# Patient Record
Sex: Female | Born: 1955 | Race: White | Hispanic: No | Marital: Married | Smoking: Former smoker
Health system: Southern US, Community
[De-identification: ages and names within clinical notes are randomized; demographics above are authoritative.]

## PROBLEM LIST (undated history)

## (undated) HISTORY — PX: BREAST REDUCTION SURGERY: SHX8

## (undated) HISTORY — PX: HAIR TRANSPLANT: SHX1719

## (undated) HISTORY — PX: LIPOSUCTION: SHX10

---

## 2007-02-05 ENCOUNTER — Encounter: Payer: Self-pay | Admitting: Obstetrics & Gynecology

## 2007-02-05 ENCOUNTER — Ambulatory Visit: Payer: Self-pay | Admitting: Obstetrics & Gynecology

## 2008-02-11 ENCOUNTER — Ambulatory Visit: Payer: Self-pay | Admitting: Family Medicine

## 2008-02-11 ENCOUNTER — Encounter: Payer: Self-pay | Admitting: Family Medicine

## 2009-02-17 ENCOUNTER — Encounter: Payer: Self-pay | Admitting: Obstetrics & Gynecology

## 2009-02-17 ENCOUNTER — Ambulatory Visit: Payer: Self-pay | Admitting: Obstetrics & Gynecology

## 2009-10-15 HISTORY — PX: COLONOSCOPY W/ POLYPECTOMY: SHX1380

## 2010-03-09 ENCOUNTER — Ambulatory Visit: Payer: Self-pay | Admitting: Obstetrics & Gynecology

## 2010-03-10 ENCOUNTER — Encounter: Payer: Self-pay | Admitting: Obstetrics & Gynecology

## 2010-03-10 LAB — CONVERTED CEMR LAB: TSH: 1.007 microintl units/mL (ref 0.350–4.500)

## 2011-02-27 NOTE — Assessment & Plan Note (Signed)
Kristin Douglas, Kristin Douglas NO.:  [id number]   MEDICAL RECORD NO.:  [id number]          PATIENT TYPE:  POB   LOCATION:  [facility] at [facility]         FACILITY:  [facility]   PHYSICIAN:  Allie Bossier, MD        DATE OF BIRTH:  05/25/1956   DATE OF SERVICE:  03/09/2010                                  CLINIC NOTE   Ms. Reddoch is a 55 year old married white gravida 2, para 1-0-1-1 who  is here for annual exam.  She wants a refill on her femhrt.  She has no  particular GYN complaints.   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   REVIEW OF SYSTEMS:  She had an echocardiogram that was done in April  that was negative.   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  No known drug allergies.  No latex allergies.   SOCIAL HISTORY:  She reports rare alcohol.   FAMILY HISTORY:  Negative for breast and GYN cancer, but positive for  colon cancer in her father and her grandfather.  Father also had a heart  attack Her mammogram is due and her colonoscopy was done this year and a  polyp was removed, presented polyp was previously thought to be a  hemorrhoid.   MEDICATIONS:  Oracea, tretinoin, Ritalin, omega III, clobetasol ointment  (she prefers the cream), Imitrex as necessary and RhoGAM for her hair  loss.   PHYSICAL EXAMINATION:  GENERAL:  Well nourished, well hydrated very  pleasant white female, appears younger than her stated age.  VITAL SIGNS:  Height 5 feet 3 inches, weight 144, blood pressure 128/84,  pulse is 56.  HEENT:  Normal.  BREASTS:  Normal bilaterally.  HEART:  Regular rate and rhythm.  LUNGS:  Clear to auscultation bilaterally.  ABDOMEN:  Benign.  PELVIC:  External genitalia, moderate amount of atrophy.  Cervix appears  normal.  Bimanual, uterus normal size and shape.  Adnexa nontender.  No  masses.   ASSESSMENT AND PLAN:  1. Annual exam.  I checked the Pap smear and recommend  self-breast and      self-vulvar exams.  Recommended annual mammogram.  2. Hormone replacement therapy.  I have given a refill on her femhrt.  3. Low pulse and hair loss and she can have a TSH today.      Allie Bossier, MD     MCD/MEDQ  D:  03/09/2010  T:  03/09/2010  Job:  [id number]

## 2011-02-27 NOTE — Assessment & Plan Note (Signed)
Kristin Douglas, Kristin Douglas               ACCOUNT NO.:  [id number]   MEDICAL RECORD NO.:  [id number]          PATIENT TYPE:  POB   LOCATION:  [facility]         FACILITY:  [facility]   PHYSICIAN:  Tinnie Gens, MD        DATE OF BIRTH:  03-09-1956   DATE OF SERVICE:  02/11/2008                                  CLINIC NOTE   CHIEF COMPLAINT:  Yearly exam.   HISTORY OF PRESENT ILLNESS:  The patient is a 55 year old gravida 2,  para 1-0-1-1 who is here today for yearly exam.  She is without  significant complaint.  She continues on HRT and has been on it for  several years now.  She reports continued symptoms even with a lower  dosage of Femhrt which was given to her last year of not feeling  herself, not acting quite right, increased hot flashes and she desires  to stay on her HRT.   PAST MEDICAL HISTORY:  1. Significant for hemorrhoids.  2. ADD.  3. History of lichen sclerosis or lichen planus.  4. Congenital AVM of the liver.  No change after two ultrasounds.   PAST SURGICAL HISTORY:  1. Cholecystectomy.  2. Appendectomy.  3. Breast reduction.  4. Liposuction.   FAMILY HISTORY:  No breast or GYN cancer.  She has a history of colon  cancer in her father who died at age 37.  Her last colonoscopy was in  2007 in [place].  She gets one every 5 years.   SOCIAL HISTORY:  No tobacco, rare alcohol use.  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   MEDICATIONS:  1. She is on Femhrt 1.5 one p.o. daily.  2. __________ given to her for rosacea by her dermatologist.  3. She is on a multivitamin daily.  4. Cod liver oil pills.  5. Biotin daily.   ALLERGIES:  None known.   OBSTETRICAL HISTORY:  G 1.  She has one living child, one vaginal  delivery.   GYN HISTORY:  Negative.   REVIEW OF SYSTEMS:  Reviewed.  She denies headache, vision changes,  chest pain, shortness of breath, trouble with nausea, vomiting,  diarrhea, constipation.  She is  sexually active without difficulty.  There is no breast lumps noted.  The patient complains of hair loss and  fatigue and weight gain.   PHYSICAL EXAMINATION:  Vitals are as in the chart.  Blood pressure is  135/93. Last year it was 130/82.  Weight is 151 pounds, up 6 pounds from  last year.  GENERAL:  She is a well-developed, well-nourished female in no acute  distress.  NECK:  Supple with normal thyroid.  LUNGS:  Clear bilaterally.  CV:  Regular rate and rhythm. No rubs, gallops or murmurs.  ABDOMEN:  Soft, nontender, nondistended.  EXTREMITIES:  No cyanosis, clubbing or edema.  2+ distal pulses.  BREASTS:  Symmetric with everted nipples.  Scars from breast reduction  are easily visible, but well-healed.  There is no significant masses or  supraclavicular or axillary adenopathy.  GU:  Normal external female genitalia.  BUS was normal.  Vagina  is  atrophic and pale.  There is loss of rugation.  The cervix is parous  without lesion.  Uterus is small, anteverted.  No adnexal mass or  tenderness.   IMPRESSION:  Yearly exam and fatigue.   PLAN:  1. Pap smear today.  2. Schedule mammogram.  3. Check TSH and lipid panel.  4. Refill Femhrt.           ______________________________  Tinnie Gens, MD     TP/MEDQ  D:  02/11/2008  T:  02/11/2008  Job:  [id number]

## 2011-02-27 NOTE — Assessment & Plan Note (Signed)
Kristin Douglas, Kristin Douglas               ACCOUNT NO.:  [id number]   MEDICAL RECORD NO.:  [id number]          PATIENT TYPE:  POB   LOCATION:  [facility]         FACILITY:  [facility]   PHYSICIAN:  Allie Bossier, MD        DATE OF BIRTH:  1956/06/03   DATE OF SERVICE:  02/17/2009                                  CLINIC NOTE   Kristin Douglas is a 55 year old married white G2, P1, A1 who is here for  annual exam.  She has no particular complaints today.  Today, she  complains of dyspareunia secondary to vaginal atrophy.  She also needs  refill on her Temovate ointment and her femhrt.   PAST MEDICAL HISTORY:  Lichen sclerosis/lichen planus, borderline  elevated cholesterol, please note it was checked January 22, 2009, ADD,  hemorrhoids, and acne.   PAST SURGICAL HISTORY:  She had cholecystectomy, appendectomy, breast  reduction, and liposuction.   FAMILY HISTORY:  Negative for breast and GYN cancers.  Her father had  colon cancer and died of an MI at 66.   SOCIAL HISTORY:  She reports rare alcohol, but no tobacco or drug use.   ALLERGIES:  No known drug allergies.  No latex allergies.   REVIEW OF SYSTEMS:  She is married for 22 years.  She is sexually  active.  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   MEDICATIONS:  1. Femhrt 1/5 ratio 1 daily.  2. Tretinoin 0.5% topically.  3. Ritalin 10 mg a day.  4. Clobetasol ointment three times a week to her vulva.   PHYSICAL EXAMINATION:  VITAL SIGNS:  Weight 147 pounds, height 5 feet 3  inches, blood pressure 133/88, pulse 64.  HEENT:  Normal.  HEART:  Regular rate and rhythm.  LUNGS:  Clear to auscultation bilaterally.  BREAST:  Normal bilaterally.  ABDOMEN:  Benign.  No hepatosplenomegaly.  EXTERNAL GENITALIA:  She has vulvar changes consistent with lichen  sclerosis especially around the clitoris, is a little bit in the  posterior fourchette.  She has certainly got atrophy.  Her cervix  appears healthy without lesions.   Bimanual reveals normal size, shape,  anteverted, mobile uterus.  Adnexa are nonenlarged and nontender.   ASSESSMENT AND PLAN:  Annual exam.  I am refilling all her medications  in addition for the symptomatic vaginal atrophy and giving her samples  and prescription of Vagifem tablets 25 mcg two times a week.  She will  follow up in a year or sooner as necessary.      Allie Bossier, MD     MCD/MEDQ  D:  02/17/2009  T:  02/18/2009  Job:  [id number]

## 2011-04-05 ENCOUNTER — Ambulatory Visit (INDEPENDENT_AMBULATORY_CARE_PROVIDER_SITE_OTHER): Payer: No Typology Code available for payment source | Admitting: Obstetrics & Gynecology

## 2011-04-05 DIAGNOSIS — Z01419 Encounter for gynecological examination (general) (routine) without abnormal findings: Secondary | ICD-10-CM

## 2011-04-05 DIAGNOSIS — Z1272 Encounter for screening for malignant neoplasm of vagina: Secondary | ICD-10-CM

## 2011-04-06 NOTE — Assessment & Plan Note (Unsigned)
NAMEFREDERIKA, HUKILL NO.:  [id number]  MEDICAL RECORD NO.:  [id number]           PATIENT TYPE:  LOCATION:  [facility]           FACILITY:  PHYSICIAN:  Allie Bossier, MD             DATE OF BIRTH:  DATE OF SERVICE:  04/05/2011                                 CLINIC NOTE  Ms. Mangano is a 55 year old married white G2, P1-0-1-1, who is here for annual exam.  She has no particular GYN complaints.  She does not need refills on any medicines today.  PAST MEDICAL HISTORY:  ADD, lichen sclerosus/planus, history of a angioma/AVM of her liver.  REVIEW OF SYSTEMS:  Married for the last 22 years.  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  She is status post echocardiogram in April 2011 that was negative.  She has had multiple colonoscopies, last of which was in 2011, with polyp removal.  She states up to date on her mammogram.  She has intercourse and uses vaginal lubrication.  She denies dyspareunia.  , elevated cholesterol, hemorrhoids, cystic acne, migraines, and she was recently diagnosed with depression (Dr. Vaughan Basta in [place], [place]).  PREVIOUS SURGERIES:  Cholecystectomy, appendectomy, breast reduction, and liposuction.  FAMILY HISTORY:  Negative for breast and GYN cancer.  Colon cancer was present her father and her paternal grandfather.  Cardiac disease is also in her family.  SOCIAL HISTORY:  Rare tobacco, rare alcohol, nothing else.  No known drug allergies.  No latex allergies.  MEDICATIONS: 1. Oracea daily. 2. Tretinoin 0.5% topical ointment daily. 3. Ritalin 10 mg p.r.n. 4. Omega-3 daily. 5. Clobetasol ointment on a p.r.n. basis. 6. Imitrex p.r.n. 7. Maxalt p.r.n. 8. Pristiq daily.  PHYSICAL EXAMINATION:  GENERAL:  Well-nourished, well-hydrated pleasant white female. VITAL SIGNS:  Height 5 feet 3 inches, weight 156, blood pressure 123/81, pulse 79. HEENT:  Normal. BREASTS:  Normal bilaterally. HEART:   Regular rate and rhythm. LUNGS:  Clear to auscultation bilaterally. ABDOMEN:  Benign. EXTERNAL GENITALIA:  Slightly atrophic, vagina atrophic.  No lesions. No abnormal discharge.  Cervix, no abnormal discharge.  Uterus normal size, shape, anteverted, mobile.  Adnexa nontender.  No masses.  ASSESSMENT AND PLAN:  Annual exam.  I have checked Pap smear. Recommended self-breast and self-vulvar exams.  She will get an annual mammogram.  I have recommended to eat oatmeal daily and weight loss.     Allie Bossier, MD    MCD/MEDQ  D:  04/05/2011  T:  04/06/2011  Job:  [id number]

## 2011-10-22 ENCOUNTER — Other Ambulatory Visit: Payer: Self-pay | Admitting: Gynecology

## 2011-10-22 ENCOUNTER — Other Ambulatory Visit (INDEPENDENT_AMBULATORY_CARE_PROVIDER_SITE_OTHER): Payer: Self-pay | Admitting: Gynecology

## 2011-10-22 DIAGNOSIS — E349 Endocrine disorder, unspecified: Secondary | ICD-10-CM

## 2011-10-22 MED ORDER — NORETHINDRONE-ETH ESTRADIOL 0.5-2.5 MG-MCG PO TABS: 1.0000 | ORAL_TABLET | Freq: Every day | ORAL | Status: DC

## 2011-10-22 NOTE — Telephone Encounter (Signed)
Spoke to patient need refill. Refill call in to pharmacy.

## 2012-02-20 ENCOUNTER — Other Ambulatory Visit: Payer: Self-pay | Admitting: Obstetrics & Gynecology

## 2012-04-03 ENCOUNTER — Encounter: Payer: Self-pay | Admitting: Obstetrics & Gynecology

## 2013-01-17 ENCOUNTER — Other Ambulatory Visit: Payer: Self-pay | Admitting: Obstetrics & Gynecology

## 2013-01-29 ENCOUNTER — Ambulatory Visit (INDEPENDENT_AMBULATORY_CARE_PROVIDER_SITE_OTHER): Admitting: Obstetrics & Gynecology

## 2013-01-29 ENCOUNTER — Encounter: Payer: Self-pay | Admitting: Obstetrics & Gynecology

## 2013-01-29 VITALS — BP 135/91 | HR 88 | Ht 63.0 in | Wt 161.0 lb

## 2013-01-29 DIAGNOSIS — Z01419 Encounter for gynecological examination (general) (routine) without abnormal findings: Secondary | ICD-10-CM

## 2013-01-29 DIAGNOSIS — Z0000 Encounter for general adult medical examination without abnormal findings: Secondary | ICD-10-CM

## 2013-01-29 DIAGNOSIS — Z124 Encounter for screening for malignant neoplasm of cervix: Secondary | ICD-10-CM

## 2013-01-29 DIAGNOSIS — Z1151 Encounter for screening for human papillomavirus (HPV): Secondary | ICD-10-CM

## 2013-01-29 MED ORDER — ESTRADIOL-LEVONORGESTREL 0.045-0.015 MG/DAY TD PTWK: 1.0000 | MEDICATED_PATCH | TRANSDERMAL | Status: DC

## 2013-01-29 NOTE — Progress Notes (Signed)
Subjective:    Kristin Douglas is a 57 y.o. female who presents for an annual exam. The patient has no complaints today. She wants a refill of HRT, would like to try a patch. The patient is sexually active. GYN screening history: last pap: was normal. The patient wears seatbelts: yes. The patient participates in regular exercise: yes. Has the patient ever been transfused or tattooed?: no. The patient reports that there is not domestic violence in her life.   Menstrual History: OB History   Grav Para Term Preterm Abortions TAB SAB Ect Mult Living   3 1 1  0 2 0 2 0 0 1      Menarche age: 65  No LMP recorded. Patient is postmenopausal.    The following portions of the patient's history were reviewed and updated as appropriate: allergies, current medications, past family history, past medical history, past social history, past surgical history and problem list.  Review of Systems A comprehensive review of systems was negative. RN at [employer], married for 25 years, some dryness with sex.   Objective:    BP 135/91  Pulse 88  Ht 5\' 3"  (1.6 m)  Wt 161 lb (73.029 kg)  BMI 28.53 kg/m2  General Appearance:    Alert, cooperative, no distress, appears stated age  Head:    Normocephalic, without obvious abnormality, atraumatic  Eyes:    PERRL, conjunctiva/corneas clear, EOM's intact, fundi    benign, both eyes  Ears:    Normal TM's and external ear canals, both ears  Nose:   Nares normal, septum midline, mucosa normal, no drainage    or sinus tenderness  Throat:   Lips, mucosa, and tongue normal; teeth and gums normal  Neck:   Supple, symmetrical, trachea midline, no adenopathy;    thyroid:  no enlargement/tenderness/nodules; no carotid   bruit or JVD  Back:     Symmetric, no curvature, ROM normal, no CVA tenderness  Lungs:     Clear to auscultation bilaterally, respirations unlabored  Chest Wall:    No tenderness or deformity   Heart:    Regular rate and rhythm, S1 and S2 normal, no murmur,  rub   or gallop  Breast Exam:    No tenderness, masses, or nipple abnormality  Abdomen:     Soft, non-tender, bowel sounds active all four quadrants,    no masses, no organomegaly  Genitalia:    Normal female without lesion, discharge or tenderness, marked atrophy of vulva/vagina. Lichen planus changes, stenotic cervix. NSSA, NT, mobile, normal adnexal exam     Extremities:   Extremities normal, atraumatic, no cyanosis or edema  Pulses:   2+ and symmetric all extremities  Skin:   Skin color, texture, turgor normal, no rashes or lesions  Lymph nodes:   Cervical, supraclavicular, and axillary nodes normal  Neurologic:   CNII-XII intact, normal strength, sensation and reflexes    throughout  .    Assessment:    Healthy female exam.    Plan:     Thin prep Pap smear.  with HPV cotesting Generic climara/pro Refill clobetasol QOD

## 2013-01-29 NOTE — Progress Notes (Signed)
Interested in changing to vivelle dot but concerned about the cost.

## 2013-02-03 ENCOUNTER — Encounter: Payer: Self-pay | Admitting: Obstetrics & Gynecology

## 2013-03-24 ENCOUNTER — Encounter: Payer: Self-pay | Admitting: Family Medicine

## 2013-03-24 ENCOUNTER — Ambulatory Visit (INDEPENDENT_AMBULATORY_CARE_PROVIDER_SITE_OTHER): Admitting: Family Medicine

## 2013-03-24 ENCOUNTER — Other Ambulatory Visit: Payer: Self-pay | Admitting: Family Medicine

## 2013-03-24 VITALS — BP 120/90 | HR 81 | Ht 63.0 in | Wt 160.0 lb

## 2013-03-24 DIAGNOSIS — N950 Postmenopausal bleeding: Secondary | ICD-10-CM

## 2013-03-24 DIAGNOSIS — Q279 Congenital malformation of peripheral vascular system, unspecified: Secondary | ICD-10-CM

## 2013-03-24 DIAGNOSIS — Q2730 Arteriovenous malformation, site unspecified: Secondary | ICD-10-CM

## 2013-03-24 DIAGNOSIS — F988 Other specified behavioral and emotional disorders with onset usually occurring in childhood and adolescence: Secondary | ICD-10-CM

## 2013-03-24 DIAGNOSIS — L940 Localized scleroderma [morphea]: Secondary | ICD-10-CM

## 2013-03-24 DIAGNOSIS — N904 Leukoplakia of vulva: Secondary | ICD-10-CM

## 2013-03-24 MED ORDER — LIDOCAINE VISCOUS 2 % MT SOLN: 15.0000 mL | OROMUCOSAL | Status: DC | PRN

## 2013-03-24 NOTE — Patient Instructions (Signed)
Postmenopausal Bleeding Menopause is commonly referred to as the "change in life." It is a time when the fertile years, the time of ovulating and having menstrual periods, has come to an end. It is also determined by not having menstrual periods for 12 months.  Postmenopausal bleeding is any bleeding a woman has after she has entered into menopause. Any type of postmenopausal bleeding, even if it appears to be a typical menstrual period, is concerning. This should be evaluated by your caregiver.  CAUSES   Hormone therapy.  Cancer of the cervix or cancer of the lining of the uterus (endometrial cancer).  Thinning of the uterine lining (uterine atrophy).  Thyroid diseases.  Certain medicines.  Infection of the uterus or cervix.  Inflammation or irritation of the uterine lining (endometritis).  Estrogen-secreting tumors.  Growths (polyps) on the cervix, uterine lining, or uterus.  Uterine tumors (fibroids).  Being very overweight (obese). DIAGNOSIS  Your caregiver will take a medical history and ask questions. A physical exam will also be performed. Further tests may include:   A transvaginal ultrasound. An ultrasound wand or probe is inserted into your vagina to view the pelvic organs.  A biopsy of the lining of the uterus (endometrium). A sample of the endometrium is removed and examined.  A hysteroscopy. Your caregiver may use an instrument with a light and a camera attached to it (hysteroscope). The hysteroscope is used to look inside the uterus for problems.  A dilation and curettage (D&C). Tissue is removed from the uterine lining to be examined for problems. TREATMENT  Treatment depends on the cause of the bleeding. Some treatments include:   Surgery.  Medicines.  Hormones.  A hysteroscopy or D&C to remove polyps or fibroids.  Changing or stopping a current medicine you are taking. Talk to your caregiver about your specific treatment. HOME CARE INSTRUCTIONS    Maintain a healthy weight.  Keep regular pelvic exams and Pap tests. SEEK MEDICAL CARE IF:   You have bleeding, even if it is light in comparison to your previous periods.  Your bleeding lasts more than 1 week.  You have abdominal pain.  You develop bleeding with sexual intercourse. SEEK IMMEDIATE MEDICAL CARE IF:   You have a fever, chills, headache, dizziness, muscle aches, and bleeding.  You have severe pain with bleeding.  You are passing blood clots.  You have bleeding and need more than 1 pad an hour.  You feel faint. MAKE SURE YOU:  Understand these instructions.  Will watch your condition.  Will get help right away if you are not doing well or get worse. Document Released: 01/09/2006 Document Revised: 12/24/2011 Document Reviewed: 06/07/2011 ExitCare Patient Information 2014 [employer].  

## 2013-03-24 NOTE — Assessment & Plan Note (Addendum)
Given PMB, will hold HRT for now, check U/S.  Have given pt. rx for viscous lidocaine if needed for possible EMB, I have discussed this with her, she declines it today.  Given that she works in [place], will arrange f/u at the [place] office.

## 2013-03-24 NOTE — Progress Notes (Signed)
  Subjective:    Patient ID: Kristin Douglas, female    DOB: 1956/08/23, 57 y.o.   MRN: [id number]  HPI Seen by dr. Marice Potter in 01/2013.  HRT changed from Femhrt to ClimaraPro.  She was tolerating ok, except for headaches on patch change day and some stomach cramps.  Began bleeding 5 days ago, which has been heavy like a cycle.  Has h/o atrophic vaginitis and lichen sclerosis, but does not easily tolerate speculum exams.  Reports being on HRT x 8-9 years, stopped them when started bleeding this weekend and has been tearful and moody.  She is worried about stopping her hormones.  She had previous D & C for possible thickened endometrium while a pt. In [place] with Dr. Marice Potter.   Review of Systems  Constitutional: Positive for unexpected weight change (weight loss).  Respiratory: Negative for shortness of breath.   Cardiovascular: Negative for chest pain.  Gastrointestinal: Negative for abdominal pain.  Genitourinary: Positive for vaginal pain. Negative for dysuria.       Objective:   Physical Exam  Vitals reviewed. Constitutional: She appears well-developed and well-nourished.  HENT:  Head: Normocephalic and atraumatic.  Eyes: No scleral icterus.  Neck: Neck supple.  Cardiovascular: Normal rate.   Pulmonary/Chest: Effort normal.  Abdominal: Soft.          Assessment & Plan:

## 2013-03-26 ENCOUNTER — Encounter: Payer: Self-pay | Admitting: Family Medicine

## 2013-03-26 ENCOUNTER — Ambulatory Visit (HOSPITAL_BASED_OUTPATIENT_CLINIC_OR_DEPARTMENT_OTHER)
Admission: RE | Admit: 2013-03-26 | Discharge: 2013-03-26 | Disposition: A | Discharge status: Home / Self Care | Source: Ambulatory Visit | Attending: Family Medicine | Admitting: Family Medicine

## 2013-03-26 ENCOUNTER — Ambulatory Visit (HOSPITAL_COMMUNITY)

## 2013-03-26 DIAGNOSIS — N950 Postmenopausal bleeding: Secondary | ICD-10-CM

## 2013-04-07 ENCOUNTER — Telehealth: Payer: Self-pay | Admitting: *Deleted

## 2013-04-07 ENCOUNTER — Ambulatory Visit: Admitting: Obstetrics & Gynecology

## 2013-04-07 NOTE — Telephone Encounter (Signed)
Pt aware of results 

## 2013-04-07 NOTE — Telephone Encounter (Signed)
Message copied by Grayland Ormond on Tue Apr 07, 2013 11:50 AM ------      Message from: Reva Bores      Created: Thu Mar 26, 2013  4:42 PM       Lining is thin--that is reassuring--please pass on to pt. ------

## 2013-04-09 ENCOUNTER — Encounter: Payer: Self-pay | Admitting: Obstetrics & Gynecology

## 2013-04-09 ENCOUNTER — Ambulatory Visit (INDEPENDENT_AMBULATORY_CARE_PROVIDER_SITE_OTHER): Admitting: Obstetrics & Gynecology

## 2013-04-09 VITALS — BP 154/84 | HR 102 | Resp 17 | Ht 63.0 in | Wt 163.0 lb

## 2013-04-09 DIAGNOSIS — N950 Postmenopausal bleeding: Secondary | ICD-10-CM

## 2013-04-09 NOTE — Progress Notes (Signed)
  Subjective:    Patient ID: Kristin Douglas, female    DOB: 1956/06/26, 57 y.o.   MRN: [id number]  HPI  She had one occasion of PMB. Her u/s showed an endometial lining of 2mm.  Review of Systems     Objective:   Physical Exam        Assessment & Plan:   PMB- She is symptomatic without her HRT and she would like to restart. This is fine with me. I have explained that her u/s rules out 99% of uterine cancer cases. I did offer her a EMBX but I am not strongly recommending it.

## 2013-08-20 ENCOUNTER — Other Ambulatory Visit: Payer: Self-pay

## 2013-10-20 ENCOUNTER — Telehealth: Payer: Self-pay | Admitting: *Deleted

## 2013-10-20 DIAGNOSIS — N951 Menopausal and female climacteric states: Secondary | ICD-10-CM

## 2013-10-20 MED ORDER — CLOBETASOL PROPIONATE 0.05 % EX OINT: 1.0000 "application " | TOPICAL_OINTMENT | Freq: Two times a day (BID) | CUTANEOUS | Status: DC

## 2013-10-20 NOTE — Telephone Encounter (Signed)
Pt called and needed a refill on Clobetasol ointment.  I have sent in a refill on her Clobetasol.  Pt aware.

## 2014-03-09 ENCOUNTER — Telehealth: Payer: Self-pay | Admitting: *Deleted

## 2014-03-09 ENCOUNTER — Other Ambulatory Visit: Payer: Self-pay | Admitting: Obstetrics & Gynecology

## 2014-03-09 DIAGNOSIS — Z780 Asymptomatic menopausal state: Secondary | ICD-10-CM

## 2014-03-09 MED ORDER — NORETHINDRONE-ETH ESTRADIOL 0.5-2.5 MG-MCG PO TABS: 2.0000 | ORAL_TABLET | Freq: Every day | ORAL | Status: DC

## 2014-03-09 NOTE — Telephone Encounter (Signed)
Patient needs refill of femhrt.

## 2014-04-01 ENCOUNTER — Encounter: Payer: Self-pay | Admitting: Obstetrics & Gynecology

## 2014-04-01 ENCOUNTER — Ambulatory Visit (INDEPENDENT_AMBULATORY_CARE_PROVIDER_SITE_OTHER): Admitting: Obstetrics & Gynecology

## 2014-04-01 VITALS — BP 115/81 | HR 75 | Ht 63.0 in | Wt 158.0 lb

## 2014-04-01 DIAGNOSIS — N951 Menopausal and female climacteric states: Secondary | ICD-10-CM

## 2014-04-01 DIAGNOSIS — Z01419 Encounter for gynecological examination (general) (routine) without abnormal findings: Secondary | ICD-10-CM

## 2014-04-01 DIAGNOSIS — Z0000 Encounter for general adult medical examination without abnormal findings: Secondary | ICD-10-CM

## 2014-04-01 DIAGNOSIS — Z124 Encounter for screening for malignant neoplasm of cervix: Secondary | ICD-10-CM

## 2014-04-01 DIAGNOSIS — Z1151 Encounter for screening for human papillomavirus (HPV): Secondary | ICD-10-CM

## 2014-04-01 DIAGNOSIS — Z780 Asymptomatic menopausal state: Secondary | ICD-10-CM

## 2014-04-01 MED ORDER — NORETHINDRONE-ETH ESTRADIOL 0.5-2.5 MG-MCG PO TABS: 2.0000 | ORAL_TABLET | Freq: Every day | ORAL | Status: AC

## 2014-04-01 NOTE — Progress Notes (Signed)
Subjective:    [name] is a 58 y.o. female who presents for an annual exam. The patient has no complaints today. The patient oral sex sexually active. GYN screening history: last pap: was normal. The patient wears seatbelts: yes. The patient participates in regular exercise: yes. Has the patient ever been transfused or tattooed?: no. The patient reports that there is not domestic violence in her life.   Menstrual History: OB History   Grav Para Term Preterm Abortions TAB SAB Ect Mult Living   3 1 1  0 2 0 2 0 0 1      Menarche age: [age]  No LMP recorded. Patient is postmenopausal.    The following portions of the patient's history were reviewed and updated as appropriate: allergies, current medications, past family history, past medical history, past social history, past surgical history and problem list.  Review of Systems A comprehensive review of systems was negative. Married for 26 years. 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   Objective:    BP 115/81  Pulse 75  Ht 5\' 3"  (1.6 m)  Wt 158 lb (71.668 kg)  BMI 28.00 kg/m2  General Appearance:    Alert, cooperative, no distress, appears stated age  Head:    Normocephalic, without obvious abnormality, atraumatic  Eyes:    PERRL, conjunctiva/corneas clear, EOM's intact, fundi    benign, both eyes  Ears:    Normal TM's and external ear canals, both ears  Nose:   Nares normal, septum midline, mucosa normal, no drainage    or sinus tenderness  Throat:   Lips, mucosa, and tongue normal; teeth and gums normal  Neck:   Supple, symmetrical, trachea midline, no adenopathy;    thyroid:  no enlargement/tenderness/nodules; no carotid   bruit or JVD  Back:     Symmetric, no curvature, ROM normal, no CVA tenderness  Lungs:     Clear to auscultation bilaterally, respirations unlabored  Chest Wall:    No tenderness or deformity   Heart:    Regular rate and rhythm, S1 and S2 normal, no murmur, rub   or gallop  Breast Exam:    No tenderness, masses,  or nipple abnormality  Abdomen:     Soft, non-tender, bowel sounds active all four quadrants,    no masses, no organomegaly  Genitalia:    Normal female without lesion, discharge or tenderness, severe atrophy, no evidence of lichen sclerosis today, normal bimanual and speculum exams     Extremities:   Extremities normal, atraumatic, no cyanosis or edema  Pulses:   2+ and symmetric all extremities  Skin:   Skin color, texture, turgor normal, no rashes or lesions  Lymph nodes:   Cervical, supraclavicular, and axillary nodes normal  Neurologic:   CNII-XII intact, normal strength, sensation and reflexes    throughout  .    Assessment:    Healthy female exam.    Plan:     Breast self exam technique reviewed and patient encouraged to perform self-exam monthly. Mammogram. Thin prep Pap smear. with cotesting Refill of femhrt and clobetasl

## 2014-04-05 LAB — CYTOLOGY - PAP

## 2014-04-10 IMAGING — US US TRANSVAGINAL NON-OB
1 series · 13 of 25 positions shown · non-contrast
Comparison: None.

CLINICAL DATA: Post menopausal bleeding after switching hormone
medication.

TRANSABDOMINAL AND TRANSVAGINAL ULTRASOUND OF PELVIS
TECHNIQUE: Both transabdominal and transvaginal ultrasound
examinations of the pelvis were performed. Transabdominal technique
was performed for global imaging of the pelvis including uterus,
ovaries, adnexal regions, and pelvic cul-de-sac.
It was necessary to proceed with endovaginal exam following the
transabdominal exam to visualize the uterus, endometrium, ovaries
and adnexal regions.

[Series 1: us transvaginal non-ob · 0.37mm/px · 13 of 48 slices shown]
[im 1/48]
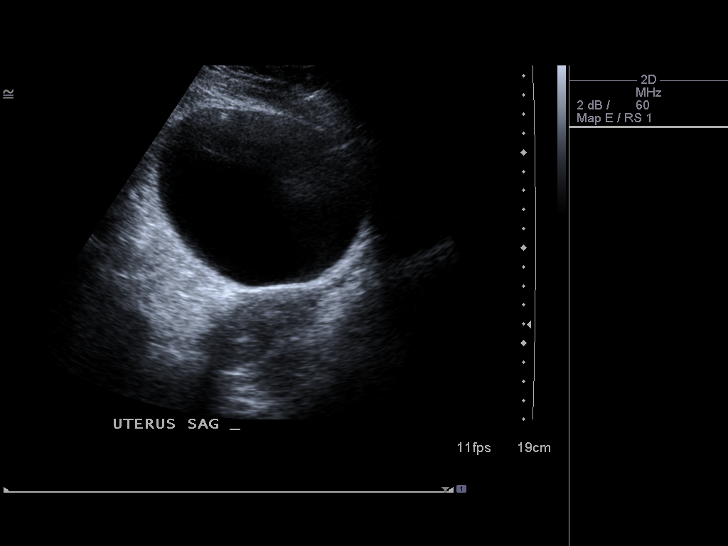
[im 4/48]
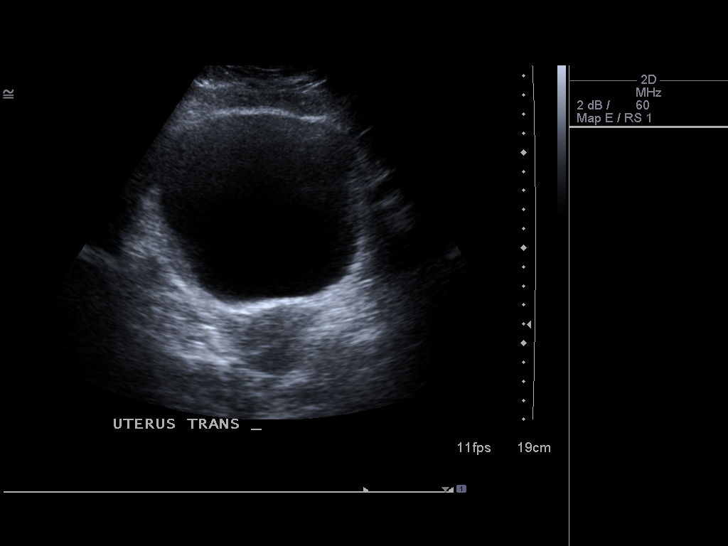
[im 8/48]
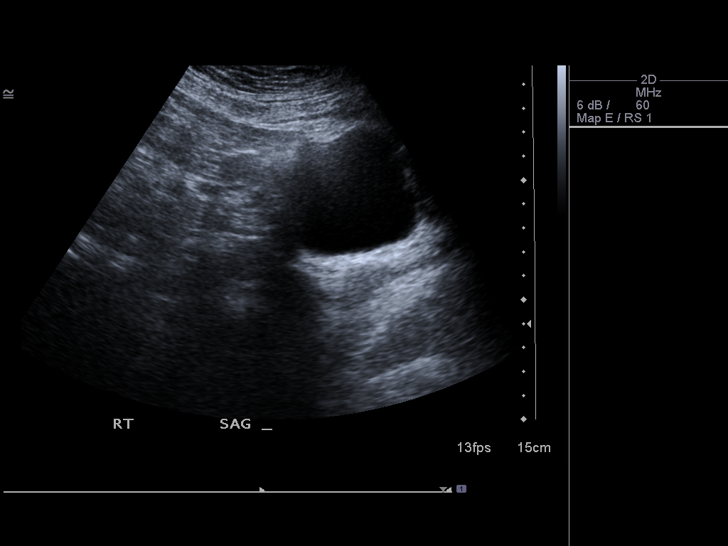
[im 12/48]
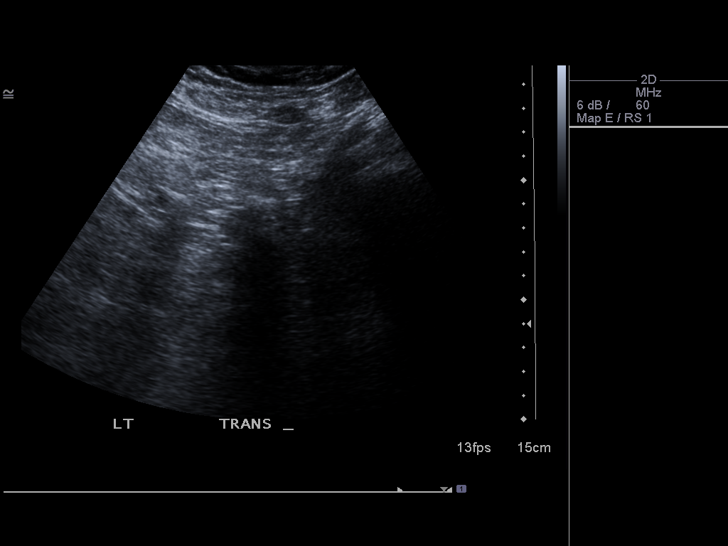
[im 16/48]
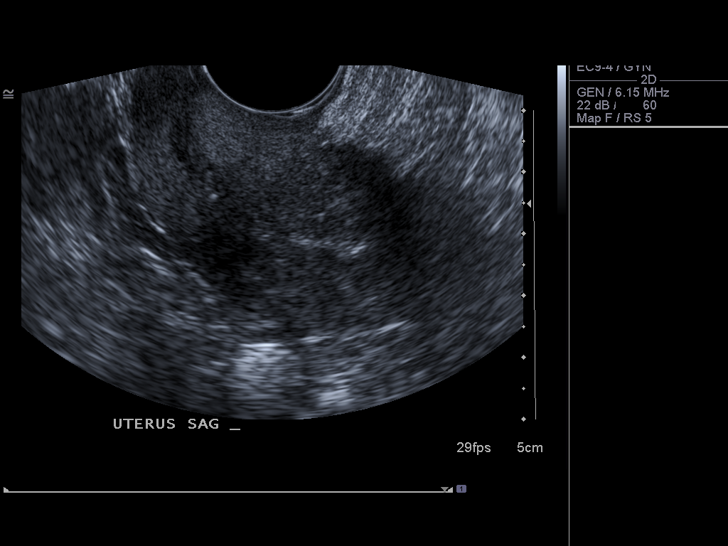
[im 20/48]
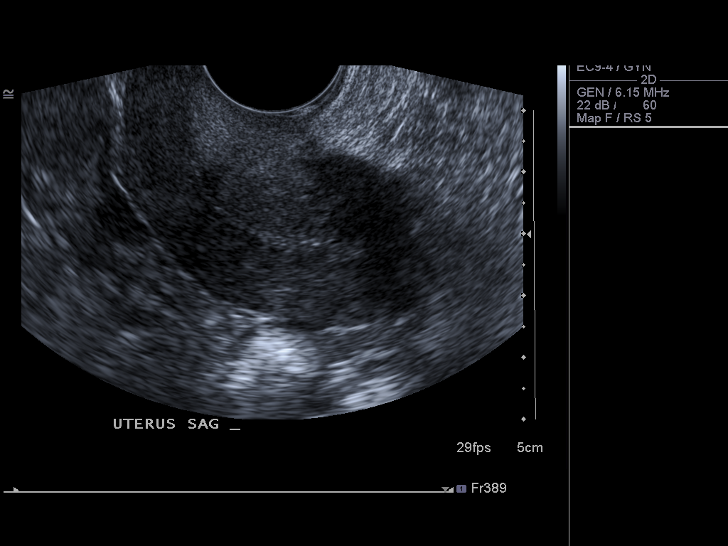
[im 24/48]
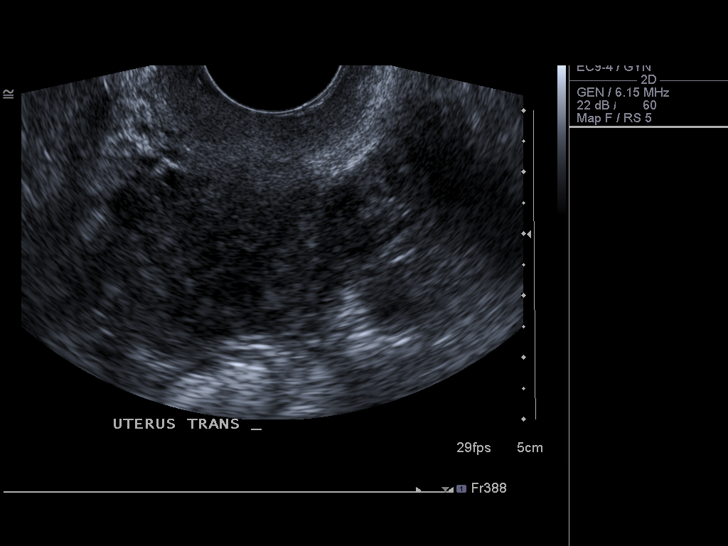
[im 28/48]
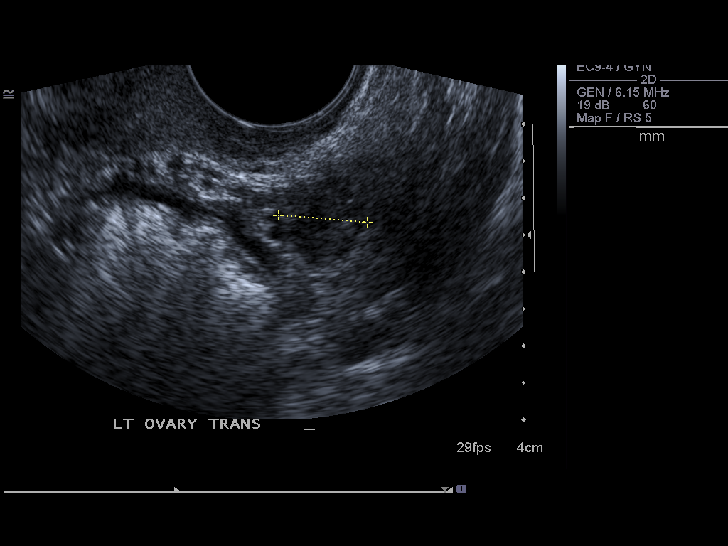
[im 32/48]
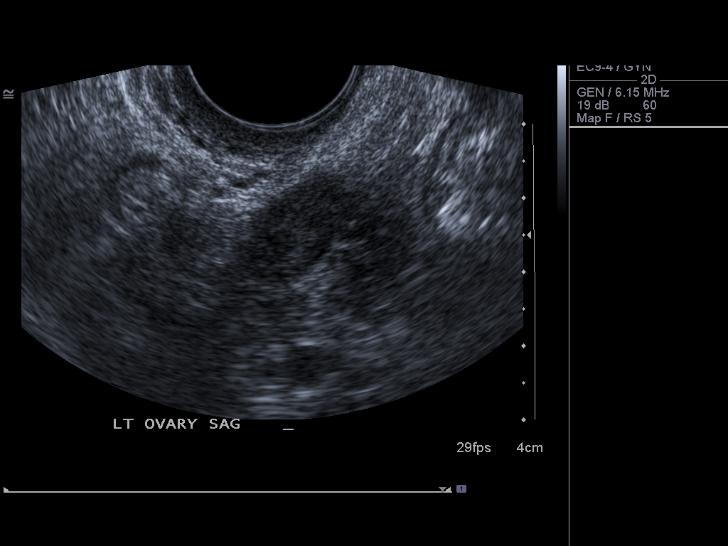
[im 36/48]
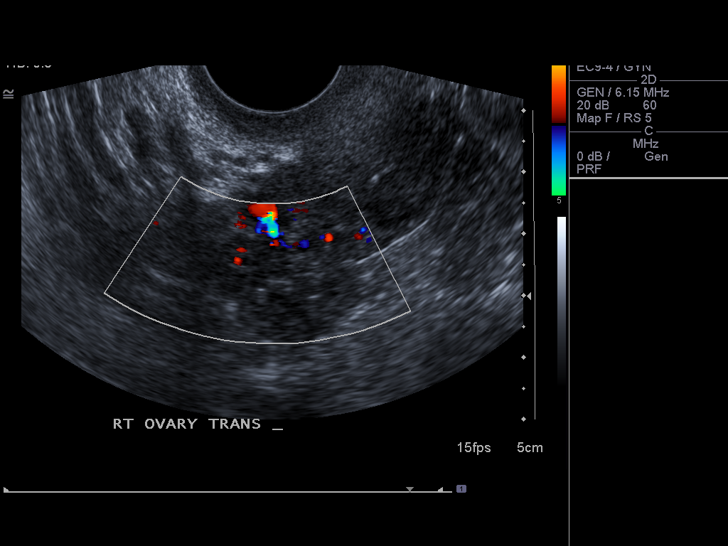
[im 40/48]
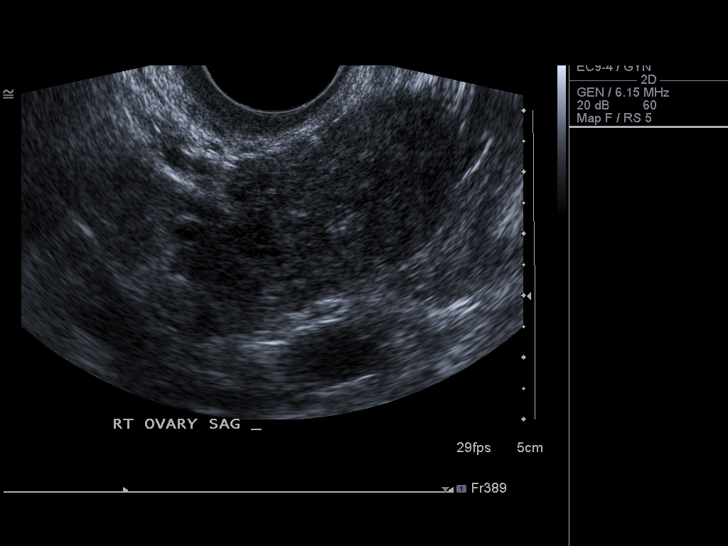
[im 44/48]
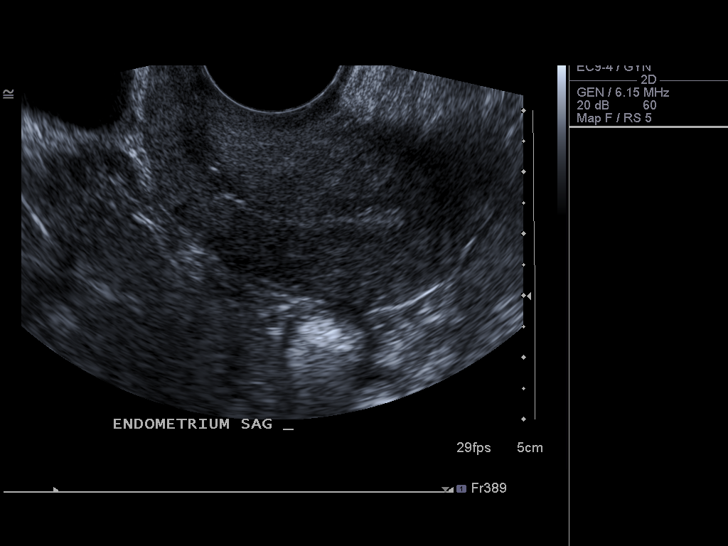
[im 48/48]
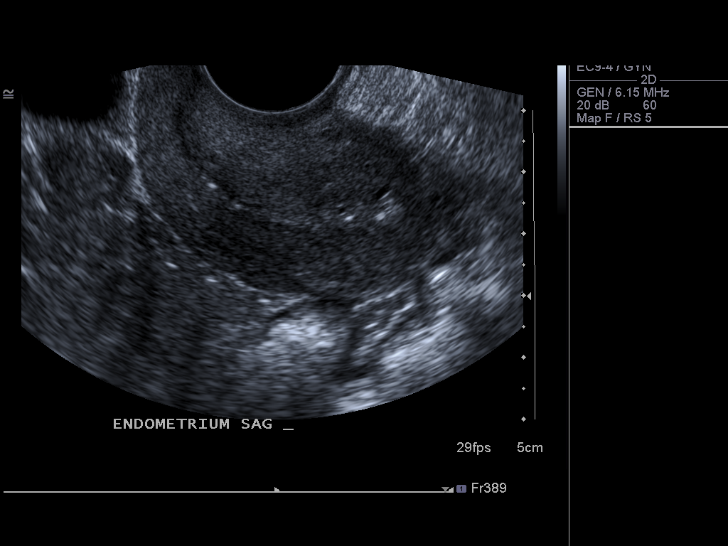

[13 of 25 positions shown; findings below may reference images not displayed]

FINDINGS: Uterus: Measures 5.5 x 2.7 x 4.2 cm and is uniform in parenchymal
echogenicity.

Endometrium: Measures 2 mm, within normal limits.

Right ovary:  Measures 1.5 x 1.1 x 1.2 cm, negative.

Left ovary: Measures 1.6 x 1.1 x 1.2 cm, negative.

Other findings: No free fluid.
IMPRESSION: Endometrium measures 2 mm. In the setting of post-menopausal
bleeding, this is consistent with a benign etiology such as
endometrial atrophy.  If bleeding remains unresponsive to hormonal
or medical therapy, sonohysterogram should be considered for focal
lesion work-up. (Ref:  Radiological Reasoning: Algorithmic Workup
of Abnormal Vaginal Bleeding with Endovaginal Sonography and
Sonohysterography. AJR 1882; 191:S68-73).

## 2014-07-08 ENCOUNTER — Telehealth: Payer: Self-pay

## 2014-07-08 NOTE — Telephone Encounter (Signed)
Pharmacy called to confirm medication and get approval for ointment Levatol 0.05%.

## 2014-08-02 ENCOUNTER — Telehealth: Payer: Self-pay | Admitting: *Deleted

## 2014-08-04 NOTE — Telephone Encounter (Signed)
Pt called and needed refills on her Femhrt.   I have called in refills to her mail order pharmacy with refills.

## 2014-08-16 ENCOUNTER — Encounter: Payer: Self-pay | Admitting: Obstetrics & Gynecology

## 2015-04-19 ENCOUNTER — Other Ambulatory Visit: Payer: Self-pay | Admitting: Obstetrics & Gynecology
